# Patient Record
Sex: Female | Born: 1980 | Race: Black or African American | Hispanic: No | Marital: Single | Smoking: Never smoker
Health system: Southern US, Community
[De-identification: ages and names within clinical notes are randomized; demographics above are authoritative.]

## PROBLEM LIST (undated history)

## (undated) DIAGNOSIS — F32A Depression, unspecified: Secondary | ICD-10-CM

## (undated) DIAGNOSIS — F419 Anxiety disorder, unspecified: Secondary | ICD-10-CM

## (undated) DIAGNOSIS — F329 Major depressive disorder, single episode, unspecified: Secondary | ICD-10-CM

---

## 2011-01-16 ENCOUNTER — Encounter: Payer: Self-pay | Admitting: Emergency Medicine

## 2011-01-16 ENCOUNTER — Emergency Department (INDEPENDENT_AMBULATORY_CARE_PROVIDER_SITE_OTHER)
Admission: EM | Admit: 2011-01-16 | Discharge: 2011-01-16 | Disposition: A | Discharge status: Home / Self Care | Payer: Self-pay | Source: Home / Self Care | Attending: Family Medicine | Admitting: Family Medicine

## 2011-01-16 DIAGNOSIS — T148XXA Other injury of unspecified body region, initial encounter: Secondary | ICD-10-CM

## 2011-01-16 DIAGNOSIS — M940 Chondrocostal junction syndrome [Tietze]: Secondary | ICD-10-CM

## 2011-01-16 MED ORDER — NAPROXEN 375 MG PO TABS: 375.0000 mg | ORAL_TABLET | Freq: Two times a day (BID) | ORAL | Status: AC

## 2011-01-16 MED ORDER — HYDROCODONE-ACETAMINOPHEN 5-325 MG PO TABS: ORAL_TABLET | ORAL | Status: AC

## 2011-01-16 NOTE — ED Notes (Signed)
Family at bedside. 
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
## 2011-01-16 NOTE — ED Notes (Signed)
Accompanied dr Juanetta Gosling during exam.  Patient has soreness left lower abdomen, slight redness.  Pain increases with movement.  Pain in left mid back, no visible sign of injury, worse with deep inspiration.

## 2012-06-16 NOTE — ED Notes (Signed)
Chart review.

## 2014-09-25 ENCOUNTER — Emergency Department (HOSPITAL_COMMUNITY)
Admission: EM | Admit: 2014-09-25 | Discharge: 2014-09-25 | Discharge status: Absent without leave | Payer: Medicaid Other | Source: Home / Self Care

## 2014-10-25 ENCOUNTER — Encounter (HOSPITAL_COMMUNITY): Payer: Self-pay | Admitting: Emergency Medicine

## 2014-10-25 ENCOUNTER — Emergency Department (HOSPITAL_COMMUNITY)
Admission: EM | Admit: 2014-10-25 | Discharge: 2014-10-25 | Discharge status: Against Medical Advice | Payer: MEDICAID | Attending: Emergency Medicine | Admitting: Emergency Medicine

## 2014-10-25 ENCOUNTER — Emergency Department (HOSPITAL_COMMUNITY)
Admission: EM | Admit: 2014-10-25 | Discharge: 2014-10-25 | Disposition: A | Discharge status: Home / Self Care | Payer: Medicaid Other | Source: Home / Self Care | Attending: Family Medicine | Admitting: Family Medicine

## 2014-10-25 DIAGNOSIS — F329 Major depressive disorder, single episode, unspecified: Secondary | ICD-10-CM | POA: Insufficient documentation

## 2014-10-25 DIAGNOSIS — F419 Anxiety disorder, unspecified: Secondary | ICD-10-CM

## 2014-10-25 DIAGNOSIS — F32A Depression, unspecified: Secondary | ICD-10-CM

## 2014-10-25 NOTE — ED Provider Notes (Signed)
CSN: [id number]     Arrival date & time 10/25/14  1347 History   First MD Initiated Contact with Patient 10/25/14 1616     Chief Complaint  Patient presents with  . Depression   (Consider location/radiation/quality/duration/timing/severity/associated sxs/prior Treatment) HPI Comments: 34 year old female presents to the urgent care with complaints of anxiety and depression for over a year. She is recently moved to [place] approximate 3 months ago. During this time she has not looked for a PCP or other health care provider because she did not notice protocol. Her boyfriend suggested she come to the urgent care today. Her complaints are feeling depressed almost every day. She is having problems with sleep, she feels overwhelmed and overstressed. She cries most every day. She is does not enjoy pleasurable things that she used to. Has excess worry. Denies SI or HI.  Patient is a 34 y.o. female presenting with anxiety and depression. The history is provided by the patient.  Anxiety This is a chronic problem. The current episode started more than 1 week ago. The problem occurs constantly. The problem has been gradually worsening. Pertinent negatives include no chest pain, no abdominal pain, no headaches and no shortness of breath. The symptoms are aggravated by stress. Nothing relieves the symptoms.  Depression This is a chronic problem. The current episode started more than 1 week ago. The problem occurs daily. The problem has been gradually worsening. Pertinent negatives include no chest pain, no abdominal pain, no headaches and no shortness of breath. The symptoms are aggravated by stress.    History reviewed. No pertinent past medical history. History reviewed. No pertinent past surgical history. No family history on file. Social History  Substance Use Topics  . Smoking status: Never Smoker   . Smokeless tobacco: None  . Alcohol Use: Yes   OB History    No data available     Review of  Systems  Constitutional: Positive for activity change and appetite change. Negative for fever.  HENT: Negative.   Respiratory: Negative.  Negative for shortness of breath.   Cardiovascular: Negative for chest pain.  Gastrointestinal: Negative.  Negative for abdominal pain.  Skin: Negative.   Neurological: Negative for dizziness, seizures, speech difficulty, numbness and headaches.  Psychiatric/Behavioral: Positive for depression and sleep disturbance. Negative for suicidal ideas and self-injury. The patient is nervous/anxious.     Allergies  Review of patient's allergies indicates no known allergies.  Home Medications   Prior to Admission medications   Not on File   Meds Ordered and Administered this Visit  Medications - No data to display  BP 155/98 mmHg  Pulse 103  Temp(Src) 98.3 F (36.8 C) (Oral)  Resp 16  SpO2 100%  LMP 10/25/2014 No data found.   Physical Exam  Constitutional: She is oriented to person, place, and time. She appears well-developed and well-nourished. No distress.  Eyes: EOM are normal.  Neck: Normal range of motion. Neck supple.  Cardiovascular: Normal rate.   Pulmonary/Chest: Effort normal. No respiratory distress.  Musculoskeletal: She exhibits no edema.  Neurological: She is alert and oriented to person, place, and time. She exhibits normal muscle tone.  Skin: Skin is warm and dry.  Psychiatric: Judgment normal. Her mood appears anxious. Her speech is not rapid and/or pressured, not delayed, not tangential and not slurred. She is not hyperactive, not actively hallucinating and not combative. Thought content is delusional. Cognition and memory are normal. She exhibits a depressed mood. She expresses no homicidal and no suicidal ideation. She  expresses no suicidal plans and no homicidal plans. She is attentive.  Nursing note and vitals reviewed.   ED Course  Procedures (including critical care time)  Labs Review Labs Reviewed - No data to  display  Imaging Review No results found.   Visual Acuity Review  Right Eye Distance:   Left Eye Distance:   Bilateral Distance:    Right Eye Near:   Left Eye Near:    Bilateral Near:         MDM   1. Anxiety   2. Depression    To go to [facility] now. Pt agrees and understands. Not a danger to self or other s at this time.   Hayden Rasmussen, NP 10/25/14 [phone]

## 2014-10-25 NOTE — ED Notes (Signed)
Pt called x 3 , no answer

## 2014-10-25 NOTE — Discharge Instructions (Signed)
Major Depressive Disorder °Major depressive disorder is a mental illness. It also may be called clinical depression or unipolar depression. Major depressive disorder usually causes feelings of sadness, hopelessness, or helplessness. Some people with this disorder do not feel particularly sad but lose interest in doing things they used to enjoy (anhedonia). Major depressive disorder also can cause physical symptoms. It can interfere with work, school, relationships, and other normal everyday activities. The disorder varies in severity but is longer lasting and more serious than the sadness we all feel from time to time in our lives. °Major depressive disorder often is triggered by stressful life events or major life changes. Examples of these triggers include divorce, loss of your job or home, a move, and the death of a family member or close friend. Sometimes this disorder occurs for no obvious reason at all. People who have family members with major depressive disorder or bipolar disorder are at higher risk for developing this disorder, with or without life stressors. Major depressive disorder can occur at any age. It may occur just once in your life (single episode major depressive disorder). It may occur multiple times (recurrent major depressive disorder). °SYMPTOMS °People with major depressive disorder have either anhedonia or depressed mood on nearly a daily basis for at least 2 weeks or longer. Symptoms of depressed mood include: °· Feelings of sadness (blue or down in the dumps) or emptiness. °· Feelings of hopelessness or helplessness. °· Tearfulness or episodes of crying (may be observed by others). °· Irritability (children and adolescents). °In addition to depressed mood or anhedonia or both, people with this disorder have at least four of the following symptoms: °· Difficulty sleeping or sleeping too much.   °· Significant change (increase or decrease) in appetite or weight.   °· Lack of energy or  motivation. °· Feelings of guilt and worthlessness.   °· Difficulty concentrating, remembering, or making decisions. °· Unusually slow movement (psychomotor retardation) or restlessness (as observed by others).   °· Recurrent wishes for death, recurrent thoughts of self-harm (suicide), or a suicide attempt. °People with major depressive disorder commonly have persistent negative thoughts about themselves, other people, and the world. People with severe major depressive disorder may experience distorted beliefs or perceptions about the world (psychotic delusions). They also may see or hear things that are not real (psychotic hallucinations). °DIAGNOSIS °Major depressive disorder is diagnosed through an assessment by your health care provider. Your health care provider will ask about aspects of your daily life, such as mood, sleep, and appetite, to see if you have the diagnostic symptoms of major depressive disorder. Your health care provider may ask about your medical history and use of alcohol or drugs, including prescription medicines. Your health care provider also may do a physical exam and blood work. This is because certain medical conditions and the use of certain substances can cause major depressive disorder-like symptoms (secondary depression). Your health care provider also may refer you to a mental health specialist for further evaluation and treatment. °TREATMENT °It is important to recognize the symptoms of major depressive disorder and seek treatment. The following treatments can be prescribed for this disorder:   °· Medicine. Antidepressant medicines usually are prescribed. Antidepressant medicines are thought to correct chemical imbalances in the brain that are commonly associated with major depressive disorder. Other types of medicine may be added if the symptoms do not respond to antidepressant medicines alone or if psychotic delusions or hallucinations occur. °· Talk therapy. Talk therapy can be  helpful in treating major depressive disorder by providing   support, education, and guidance. Certain types of talk therapy also can help with negative thinking (cognitive behavioral therapy) and with relationship issues that trigger this disorder (interpersonal therapy). A mental health specialist can help determine which treatment is best for you. Most people with major depressive disorder do well with a combination of medicine and talk therapy. Treatments involving electrical stimulation of the brain can be used in situations with extremely severe symptoms or when medicine and talk therapy do not work over time. These treatments include electroconvulsive therapy, transcranial magnetic stimulation, and vagal nerve stimulation.   This information is not intended to replace advice given to you by your health care provider. Make sure you discuss any questions you have with your health care provider.   Document Released: 05/01/2012 Document Revised: 01/25/2014 Document Reviewed: 05/01/2012 Elsevier Interactive Patient Education 2016 Else[employer] Inc.  Generalized Anxiety Disorder Generalized anxiety disorder (GAD) is a mental disorder. It interferes with life functions, including relationships, work, and school. GAD is different from normal anxiety, which everyone experiences at some point in their lives in response to specific life events and activities. Normal anxiety actually helps [place] prepare for and get through these life events and activities. Normal anxiety goes away after the event or activity is over.  GAD causes anxiety that is not necessarily related to specific events or activities. It also causes excess anxiety in proportion to specific events or activities. The anxiety associated with GAD is also difficult to control. GAD can vary from mild to severe. People with severe GAD can have intense waves of anxiety with physical symptoms (panic attacks).  SYMPTOMS The anxiety and worry associated with  GAD are difficult to control. This anxiety and worry are related to many life events and activities and also occur more days than not for 6 months or longer. People with GAD also have three or more of the following symptoms (one or more in children):  Restlessness.   Fatigue.  Difficulty concentrating.   Irritability.  Muscle tension.  Difficulty sleeping or unsatisfying sleep. DIAGNOSIS GAD is diagnosed through an assessment by your health care provider. Your health care provider will ask you questions aboutyour mood,physical symptoms, and events in your life. Your health care provider may ask you about your medical history and use of alcohol or drugs, including prescription medicines. Your health care provider may also do a physical exam and blood tests. Certain medical conditions and the use of certain substances can cause symptoms similar to those associated with GAD. Your health care provider may refer you to a mental health specialist for further evaluation. TREATMENT The following therapies are usually used to treat GAD:   Medication. Antidepressant medication usually is prescribed for long-term daily control. Antianxiety medicines may be added in severe cases, especially when panic attacks occur.   Talk therapy (psychotherapy). Certain types of talk therapy can be helpful in treating GAD by providing support, education, and guidance. A form of talk therapy called cognitive behavioral therapy can teach you healthy ways to think about and react to daily life events and activities.  Stress managementtechniques. These include yoga, meditation, and exercise and can be very helpful when they are practiced regularly. A mental health specialist can help determine which treatment is best for you. Some people see improvement with one therapy. However, other people require a combination of therapies.   This information is not intended to replace advice given to you by your health care  provider. Make sure you discuss any questions you have with  your health care provider.   Document Released: 05/01/2012 Document Revised: 01/25/2014 Document Reviewed: 05/01/2012 Elsevier Interactive Patient Education 2016 Else[employer] Inc.  Panic Attacks Panic attacks are sudden, short feelings of great fear or discomfort. You may have them for no reason when you are relaxed, when you are uneasy (anxious), or when you are sleeping.  HOME CARE  Take all your medicines as told.  Check with your doctor before starting new medicines.  Keep all doctor visits. GET HELP IF:  You are not able to take your medicines as told.  Your symptoms do not get better.  Your symptoms get worse. GET HELP RIGHT AWAY IF:  Your attacks seem different than your normal attacks.  You have thoughts about hurting yourself or others.  You take panic attack medicine and you have a side effect. MAKE SURE YOU:  Understand these instructions.  Will watch your condition.  Will get help right away if you are not doing well or get worse.   This information is not intended to replace advice given to you by your health care provider. Make sure you discuss any questions you have with your health care provider.   Document Released: 02/06/2010 Document Revised: 10/25/2012 Document Reviewed: 08/18/2012 Elsevier Interactive Patient Education [employer]2016 [employer] Inc.

## 2014-10-25 NOTE — ED Notes (Signed)
Pt called to go to [facility] A no answer

## 2014-10-25 NOTE — ED Notes (Signed)
Pt reports anxiety and depression x 4 months but worse in last 2 weeks.  Denies SI/HI.  Denies substance abuse.

## 2014-10-25 NOTE — ED Notes (Signed)
Pt c/o depression/anxiety sx onset 3 months Reports she just recently moved to [place], [place] and has no family other than partner and his family Reports she "has no will to do anything" Denies suicidal thoughts or thoughts to harm anyone else A&O x4... No acute distress.

## 2014-11-03 ENCOUNTER — Emergency Department (HOSPITAL_COMMUNITY)
Admission: EM | Admit: 2014-11-03 | Discharge: 2014-11-03 | Disposition: A | Discharge status: Home / Self Care | Payer: MEDICAID | Attending: Emergency Medicine | Admitting: Emergency Medicine

## 2014-11-03 ENCOUNTER — Emergency Department (HOSPITAL_COMMUNITY)
Admission: EM | Admit: 2014-11-03 | Discharge: 2014-11-03 | Discharge status: Absent without leave | Payer: Medicaid Other

## 2014-11-03 ENCOUNTER — Encounter (HOSPITAL_COMMUNITY): Payer: Self-pay | Admitting: Emergency Medicine

## 2014-11-03 DIAGNOSIS — F32A Depression, unspecified: Secondary | ICD-10-CM

## 2014-11-03 DIAGNOSIS — F329 Major depressive disorder, single episode, unspecified: Secondary | ICD-10-CM | POA: Insufficient documentation

## 2014-11-03 DIAGNOSIS — F419 Anxiety disorder, unspecified: Secondary | ICD-10-CM | POA: Diagnosis present

## 2014-11-03 HISTORY — DX: Depression, unspecified: F32.A

## 2014-11-03 HISTORY — DX: Anxiety disorder, unspecified: F41.9

## 2014-11-03 HISTORY — DX: Major depressive disorder, single episode, unspecified: F32.9

## 2014-11-03 NOTE — BH Assessment (Signed)
Tele Assessment Note   Katrina Stone is a 34 y.o. female who voluntarily [facility].  Pt states that she and her 2 children moved from [place] approx 3 weeks ago to live with her boyfriend and she has not adjusted well to the move.  Pt says she doesn't have family or friends in the area and has not been able to find a job since moving here.  Pt has moved her children back to [place]  to live with their grandmother and says her anxiety and depression has worsened because she misses them and has other stressors, i.e.--employment.  Pt says she has not sought out any outpatient mental facilities because she is not familiar with this area. Pt denies mental health hx.  She denies SI/HI/AVH.  Pt endorses poor sleep and appetite, isolating self, daily crying spells, anxiety w/panic attacks, helplessness and anhedonia,  Pt says she thinking of returning to [place].  Pt says she has been struggling with depression [id number] but the move and [place] [place] has exacerbated her depressive sxs and anxiety.  Pt is requesting referrals for outpatient services.  This Clinical research associatewriter discussed disposition with Hulan FessIjeoma Nwaeze, NP who recommends d/c with outpatient referrals.      Diagnosis: Axis I: 309.28 Adjustment D/O with mixed anxiety and depressed mood                    Axis II: Deferred                    Axis III: None reported                   Axis IV: Employment, Surveyor, quantityinancial, Support, Psychosocial, Environmental                  Axis V: 41-50  Past Medical History:  Past Medical History  Diagnosis Date  . Depression   . Anxiety     Past Surgical History  Procedure Laterality Date  . Cesarean section      Family History: History reviewed. No pertinent family history.  Social History:  reports that she has never smoked. She does not have any smokeless tobacco history on file. She reports that she drinks alcohol. She reports that she does not use illicit drugs.  Additional  Social History:  Alcohol / Drug Use Pain Medications: None  Prescriptions: None  Over the Counter: None  History of alcohol / drug use?: No history of alcohol / drug abuse Longest period of sobriety (when/how long): Denies chronic use, social drinker only   CIWA: CIWA-Ar BP: (!) 155/114 mmHg Pulse Rate: 88 COWS:    PATIENT STRENGTHS: (choose at least two) Ability for insight Average or above average intelligence Capable of independent living Communication skills Motivation for treatment/growth  Allergies: No Known Allergies  Home Medications:  (Not in a hospital admission)  OB/GYN Status:  Patient's last menstrual period was 10/25/2014.  General Assessment Data [facility] of Assessment: [facility] TTS Assessment: In system Is this a Tele or Face-to-Face Assessment?: Tele Assessment Is this an Initial Assessment or a Re-assessment for this encounter?: Initial Assessment Marital status: Long term relationship Katrina FairlyMaiden name: Katrina Stone  Is patient pregnant?: No Pregnancy Status: No Living Arrangements: Spouse/significant other Can pt return to current living arrangement?: Yes Admission Status: Voluntary Is patient capable of signing voluntary admission?: Yes Referral Source: MD Insurance type: SP  Medical Screening Exam [facility] ONLY) Medical Exam completed: No Reason for  MSE not completed: Other: (None )  Crisis Care Plan Living Arrangements: Spouse/significant other Name of Psychiatrist: None  Name of Therapist: None   Education Status Is patient currently in school?: No Current Grade: None  Highest grade of school patient has completed: None  Name of school: None  Contact person: None   Risk to self with the past 6 months Suicidal Ideation: No Has patient been a risk to self within the past 6 months prior to admission? : No Suicidal Intent: No Has patient had any suicidal intent within the past 6 months prior to admission? : No Is patient at risk for suicide?:  No Suicidal Plan?: No Has patient had any suicidal plan within the past 6 months prior to admission? : No Access to Means: No What has been your use of drugs/alcohol within the last 12 months?: Social drinker  Previous Attempts/Gestures: No How many times?: 0 Other Self Harm Risks: None  Triggers for Past Attempts: None known Intentional Self Injurious Behavior: None Comment - Self Injurious Behavior: None  Family Suicide History: Yes (Brother has panic d/o ) Recent stressful life event(s): Other (Comment) (Pls See EPIC note ) Persecutory voices/beliefs?: No Depression: Yes Depression Symptoms: Tearfulness, Insomnia, Isolating, Loss of interest in usual pleasures Substance abuse history and/or treatment for substance abuse?: No Suicide prevention information given to non-admitted patients: Not applicable  Risk to Others within the past 6 months Homicidal Ideation: No Does patient have any lifetime risk of violence toward others beyond the six months prior to admission? : No Thoughts of Harm to Others: No Current Homicidal Intent: No Current Homicidal Plan: No Access to Homicidal Means: No Identified Victim: None  History of harm to others?: No Assessment of Violence: None Noted Violent Behavior Description: None  Does patient have access to weapons?: No Criminal Charges Pending?: No Does patient have a court date: No Is patient on probation?: No  Psychosis Hallucinations: None noted Delusions: None noted  Mental Status Report Appearance/Hygiene: Other (Comment) (Appropriate ) Eye Contact: Fair Motor Activity: Unremarkable Speech: Logical/coherent, Soft Level of Consciousness: Alert, Crying Mood: Depressed, Anxious Affect: Depressed, Anxious, Sad Anxiety Level: Minimal Thought Processes: Coherent, Relevant Judgement: Unimpaired Orientation: Person, Place, Time, Situation Obsessive Compulsive Thoughts/Behaviors: None  Cognitive Functioning Concentration:  Normal Memory: Recent Intact, Remote Intact IQ: Average Insight: Good Impulse Control: Good Appetite: Fair Weight Loss: 0 Weight Gain: 0 Sleep: Decreased Total Hours of Sleep: 3 Vegetative Symptoms: Staying in bed  ADLScreening [facility] Assessment Services) Patient's cognitive ability adequate to safely complete daily activities?: Yes Patient able to express need for assistance with ADLs?: Yes Independently performs ADLs?: Yes (appropriate for developmental age)  Prior Inpatient Therapy Prior Inpatient Therapy: No Prior Therapy Dates: None  Prior Therapy Facilty/Provider(s): None  Reason for Treatment: None   Prior Outpatient Therapy Prior Outpatient Therapy: No Prior Therapy Dates: None  Prior Therapy Facilty/Provider(s): None  Reason for Treatment: None  Does patient have an ACCT team?: No Does patient have Intensive In-House Services?  : No Does patient have [facility] services? : No Does patient have P4CC services?: No  ADL Screening (condition at time of admission) Patient's cognitive ability adequate to safely complete daily activities?: Yes Is the patient deaf or have difficulty hearing?: No Does the patient have difficulty seeing, even when wearing glasses/contacts?: No Does the patient have difficulty concentrating, remembering, or making decisions?: No Patient able to express need for assistance with ADLs?: Yes Does the patient have difficulty dressing or bathing?: No Independently performs ADLs?:  Yes (appropriate for developmental age) Does the patient have difficulty walking or climbing stairs?: No Weakness of Legs: None Weakness of Arms/Hands: None  Home Assistive Devices/Equipment Home Assistive Devices/Equipment: None  Therapy Consults (therapy consults require a physician order) PT Evaluation Needed: No OT Evalulation Needed: No SLP Evaluation Needed: No Abuse/Neglect Assessment (Assessment to be complete while patient is alone) Physical Abuse:  Denies Verbal Abuse: Denies Sexual Abuse: Denies Exploitation of patient/patient's resources: Denies Self-Neglect: Denies Values / Beliefs Cultural Requests During Hospitalization: None Spiritual Requests During Hospitalization: None Consults Spiritual Care Consult Needed: No Social Work Consult Needed: No Merchant navy officer (For Healthcare) Does patient have an advance directive?: No Would patient like information on creating an advanced directive?: No - patient declined information    Additional Information 1:1 In Past 12 Months?: No CIRT Risk: No Elopement Risk: No Does patient have medical clearance?: Yes     Disposition:  Disposition Initial Assessment Completed for this Encounter: Yes Disposition of Patient: Referred to (Per Hulan Fess, NP recommends d/c w/outpt referrals ) Patient referred to: Other (Comment) (Per Hulan Fess, NP recommends d/c w/outpt referrals )  Murrell Redden 11/03/2014 9:38 PM

## 2014-11-03 NOTE — ED Notes (Signed)
[age] yof presents to ED with c/o anxiety. Patient is new to the area. Does not have family and friends close by. Patient moved to be with her boyfriend. Patient has not been able to find any work. Patient moved her with her kids who have recently moved back home to [place] because they did not care of the area. Patient states she has not been able to sleep and she is worried about her future. Patient says her diet is "so-so", "Some days I will eat other days I can't get out of bed". Patient denies SI or HI.

## 2014-11-03 NOTE — ED Provider Notes (Signed)
CSN: [id number]     Arrival date & time 11/03/14  2010 History   First MD Initiated Contact with Patient 11/03/14 2053     Chief Complaint  Patient presents with  . Anxiety     (Consider location/radiation/quality/duration/timing/severity/associated sxs/prior Treatment) Patient is a 34 y.o. female presenting with anxiety. The history is provided by the patient.  Anxiety This is a new problem. The current episode started more than 1 year ago. The problem occurs constantly. The problem has been gradually worsening. Pertinent negatives include no fever. Nothing aggravates the symptoms. She has tried nothing for the symptoms. The treatment provided no relief.    Past Medical History  Diagnosis Date  . Depression   . Anxiety    Past Surgical History  Procedure Laterality Date  . Cesarean section     History reviewed. No pertinent family history. Social History  Substance Use Topics  . Smoking status: Never Smoker   . Smokeless tobacco: None  . Alcohol Use: Yes     Comment: Social   OB History    No data available     Review of Systems  Constitutional: Negative for fever.  Respiratory: Negative for shortness of breath.   Psychiatric/Behavioral: Negative for suicidal ideas. The patient is nervous/anxious.   All other systems reviewed and are negative.     Allergies  Review of patient's allergies indicates no known allergies.  Home Medications   Prior to Admission medications   Not on File   BP 155/114 mmHg  Pulse 88  Temp(Src) 98.8 F (37.1 C) (Oral)  Resp 20  SpO2 100%  LMP 10/25/2014 Physical Exam  Constitutional: She is oriented to person, place, and time. She appears well-developed and well-nourished.  Eyes: Pupils are equal, round, and reactive to light.  Neck: Normal range of motion.  Cardiovascular: Normal rate.   Pulmonary/Chest: Effort normal.  Musculoskeletal: Normal range of motion.  Neurological: She is alert and oriented to person, place, and  time.  Skin: Skin is warm.  Psychiatric: She exhibits a depressed mood.  Has been depressed for over a year.  She reports in the last month it's worse and she is moved to the area from [place].  She's been unable to find a job children also have returned to [place] because they did not like the area.  She states she is having trouble getting out of bed.  She is less interest in everyday activities, has no previous history of treatment for depression.  She is not suicidal or suicidal  Nursing note and vitals reviewed.   ED Course  Procedures (including critical care time) Labs Review Labs Reviewed - No data to display  Imaging Review No results found. I have personally reviewed and evaluated these images and lab results as part of my medical decision-making.   EKG Interpretation None     You feel the patient needs lancets at this time, but I will have her talk to TTS for outpatient referrals Patient has been assessed by TTS she has been given outpatient referrals to help deal with her depression.  She's been given.  Return parameters MDM   Final diagnoses:  Depression         Earley FavorGail Britton Perkinson, NP 11/03/14 2159  Lorre NickAnthony Allen, MD 11/03/14 [phone]

## 2014-11-03 NOTE — Discharge Instructions (Signed)
You have been assessed by our treatment specialist today.  You've been given outpatient referrals to help cope with your depression/anxiety.  Please return anytime you feel suicidal or homicidal or does feel like you cannot cope with your present.  Living situation

## 2014-11-03 NOTE — ED Notes (Signed)
Pt left without recieving discharge paperwork/discharge vitals/signing.

## 2016-04-04 ENCOUNTER — Encounter (HOSPITAL_COMMUNITY): Payer: Self-pay | Admitting: Emergency Medicine

## 2016-04-04 ENCOUNTER — Emergency Department (HOSPITAL_COMMUNITY)
Admission: EM | Admit: 2016-04-04 | Discharge: 2016-04-04 | Disposition: A | Discharge status: Home / Self Care | Payer: Medicaid Other | Attending: Emergency Medicine | Admitting: Emergency Medicine

## 2016-04-04 ENCOUNTER — Emergency Department (HOSPITAL_COMMUNITY): Payer: Medicaid Other

## 2016-04-04 DIAGNOSIS — Y999 Unspecified external cause status: Secondary | ICD-10-CM | POA: Diagnosis not present

## 2016-04-04 DIAGNOSIS — Y92410 Unspecified street and highway as the place of occurrence of the external cause: Secondary | ICD-10-CM | POA: Diagnosis not present

## 2016-04-04 DIAGNOSIS — M25512 Pain in left shoulder: Secondary | ICD-10-CM | POA: Diagnosis not present

## 2016-04-04 DIAGNOSIS — Y939 Activity, unspecified: Secondary | ICD-10-CM | POA: Insufficient documentation

## 2016-04-04 DIAGNOSIS — M791 Myalgia, unspecified site: Secondary | ICD-10-CM

## 2016-04-04 MED ORDER — NAPROXEN 500 MG PO TABS: 500.0000 mg | ORAL_TABLET | Freq: Two times a day (BID) | ORAL | 0 refills | Status: AC | PRN

## 2016-04-04 MED ORDER — NAPROXEN 500 MG PO TABS
500.0000 mg | ORAL_TABLET | Freq: Once | ORAL | Status: AC
  Administered 2016-04-04: 500 mg via ORAL
  Filled 2016-04-04: qty 1

## 2016-04-04 MED ORDER — METHOCARBAMOL 500 MG PO TABS: 500.0000 mg | ORAL_TABLET | Freq: Two times a day (BID) | ORAL | 0 refills | Status: AC | PRN
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
## 2016-04-04 NOTE — Discharge Instructions (Signed)
It was my pleasure taking care of you today!  Naproxen as needed for pain. Ice shoulder throughout the day (instructions below).  Wear shoulder sling for no more than 3 days, then begin performing gentle range of motion exercises.   Call the orthopedist listed if symptoms are not improved in one week.   Return to the ER for new or worsening symptoms, any additional concerns.  COLD THERAPY DIRECTIONS:  Ice or gel packs can be used to reduce both pain and swelling. Ice is the most helpful within the first 24 to 48 hours after an injury or flareup from overusing a muscle or joint.  Ice is effective, has very few side effects, and is safe for most people to use.   If you expose your skin to cold temperatures for too long or without the proper protection, you can damage your skin or nerves. Watch for signs of skin damage due to cold.   HOME CARE INSTRUCTIONS  Follow these tips to use ice and cold packs safely.  Place a dry or damp towel between the ice and skin. A damp towel will cool the skin more quickly, so you may need to shorten the time that the ice is used.  For a more rapid response, add gentle compression to the ice.  Ice for no more than 10 to 20 minutes at a time. The bonier the area you are icing, the less time it will take to get the benefits of ice.  Check your skin after 5 minutes to make sure there are no signs of a poor response to cold or skin damage.  Rest 20 minutes or more in between uses.  Once your skin is numb, you can end your treatment. You can test numbness by very lightly touching your skin. The touch should be so light that you do not see the skin dimple from the pressure of your fingertip. When using ice, most people will feel these normal sensations in this order: cold, burning, aching, and numbness.
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
